# Patient Record
Sex: Male | Born: 1995 | Race: Black or African American | Hispanic: No | Marital: Single | Smoking: Current some day smoker
Health system: Southern US, Community
[De-identification: ages and names within clinical notes are randomized; demographics above are authoritative.]

---
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
## 2019-11-13 ENCOUNTER — Emergency Department (HOSPITAL_COMMUNITY): Payer: Federal, State, Local not specified - PPO

## 2019-11-13 ENCOUNTER — Encounter (HOSPITAL_COMMUNITY): Payer: Self-pay | Admitting: Emergency Medicine

## 2019-11-13 ENCOUNTER — Emergency Department (HOSPITAL_COMMUNITY)
Admission: EM | Admit: 2019-11-13 | Discharge: 2019-11-13 | Disposition: A | Discharge status: Home / Self Care | Payer: Federal, State, Local not specified - PPO | Attending: Emergency Medicine | Admitting: Emergency Medicine

## 2019-11-13 ENCOUNTER — Other Ambulatory Visit: Payer: Self-pay

## 2019-11-13 DIAGNOSIS — Z79899 Other long term (current) drug therapy: Secondary | ICD-10-CM | POA: Insufficient documentation

## 2019-11-13 DIAGNOSIS — F17210 Nicotine dependence, cigarettes, uncomplicated: Secondary | ICD-10-CM | POA: Diagnosis not present

## 2019-11-13 DIAGNOSIS — N451 Epididymitis: Secondary | ICD-10-CM | POA: Diagnosis not present

## 2019-11-13 DIAGNOSIS — R109 Unspecified abdominal pain: Secondary | ICD-10-CM | POA: Diagnosis present

## 2019-11-13 LAB — URINALYSIS, ROUTINE W REFLEX MICROSCOPIC
Bilirubin Urine: NEGATIVE
Glucose, UA: NEGATIVE mg/dL
Hgb urine dipstick: NEGATIVE
Ketones, ur: NEGATIVE mg/dL
Leukocytes,Ua: NEGATIVE
Nitrite: NEGATIVE
Protein, ur: NEGATIVE mg/dL
Specific Gravity, Urine: 1.017 (ref 1.005–1.030)
pH: 6 (ref 5.0–8.0)

## 2019-11-13 LAB — RPR: RPR Ser Ql: NONREACTIVE

## 2019-11-13 LAB — HIV ANTIBODY (ROUTINE TESTING W REFLEX): HIV Screen 4th Generation wRfx: NONREACTIVE

## 2019-11-13 MED ORDER — STERILE WATER FOR INJECTION IJ SOLN
INTRAMUSCULAR | Status: AC
  Administered 2019-11-13: 09:00:00 10 mL
  Filled 2019-11-13: qty 10

## 2019-11-13 MED ORDER — CEFTRIAXONE SODIUM 500 MG IJ SOLR
500.0000 mg | Freq: Once | INTRAMUSCULAR | Status: AC
  Administered 2019-11-13: 09:00:00 500 mg via INTRAMUSCULAR
  Filled 2019-11-13: qty 500

## 2019-11-13 MED ORDER — IBUPROFEN 600 MG PO TABS: 600.0000 mg | ORAL_TABLET | Freq: Four times a day (QID) | ORAL | 0 refills | Status: AC | PRN

## 2019-11-13 MED ORDER — DOXYCYCLINE HYCLATE 100 MG PO CAPS: 100.0000 mg | ORAL_CAPSULE | Freq: Two times a day (BID) | ORAL | 0 refills | Status: AC

## 2019-11-13 NOTE — Discharge Instructions (Addendum)
You have been diagnosed with epididymitis.  Follow instruction below.  Wear scrotal supporting underwear for comfort.  Avoid any sexual activities until your symptom completely resolved.  Take antibiotic as prescribed.

## 2019-11-13 NOTE — ED Triage Notes (Signed)
Patient reports right groin pain onset 2 days ago , denies injury , no hematuria .

## 2019-11-13 NOTE — ED Provider Notes (Signed)
[facility] Provider Note   CSN: [id number] Arrival date & time: 11/13/19  0102     History Chief Complaint  Patient presents with  . Groin Pain    Andrew Floyd is a 24 y.o. male.  The history is provided by the patient. No language interpreter was used.  Groin Pain     24 year old male presenting for evaluation of right groin pain.  Patient report for the past 2 days he noticed gradual onset of pain and swelling to his right testicle.  Pain is described as an achy sensation, more of a discomfort, mild to moderate in severity without any penile discharge dysuria hematuria or rash.  No associate fever or chills.  No abdominal pain or back pain.  No new sexual partners.  Denies any prior history of STI.  He did try taking ibuprofen at home with minimal improvement.  No history of hernia in the past.  History reviewed. No pertinent past medical history.  There are no problems to display for this patient.   History reviewed. No pertinent surgical history.     No family history on file.  Social History   Tobacco Use  . Smoking status: Current Some Day Smoker  . Smokeless tobacco: Never Used  Substance Use Topics  . Alcohol use: No  . Drug use: Not on file    Home Medications Prior to Admission medications   Medication Sig Start Date End Date Taking? Authorizing Provider  acetaminophen (TYLENOL) 500 MG tablet Take 1 tablet (500 mg total) by mouth every 6 (six) hours as needed for pain. 09/04/12   Rosana Hoes, MD  cetirizine-pseudoephedrine (ZYRTEC-D) 5-120 MG per tablet Take 1 tablet by mouth daily. 09/04/12   Rosana Hoes, MD    Allergies    Patient has no known allergies.  Review of Systems   Review of Systems  Constitutional: Negative for fever.  Genitourinary: Positive for scrotal swelling and testicular pain. Negative for difficulty urinating, dysuria, flank pain, frequency, genital sores and penile pain.    Physical  Exam Updated Vital Signs BP (!) 131/92 (BP Location: Right Arm)   Pulse 73   Temp 98.3 F (36.8 C) (Oral)   Resp 18   Ht 6\' 1"  (1.854 m)   Wt 100 kg   SpO2 99%   BMI 29.09 kg/m   Physical Exam Vitals and nursing note reviewed.  Constitutional:      General: He is not in acute distress.    Appearance: He is well-developed.  HENT:     Head: Atraumatic.  Eyes:     Conjunctiva/sclera: Conjunctivae normal.  Abdominal:     General: Abdomen is flat.     Palpations: Abdomen is soft.     Tenderness: There is no abdominal tenderness.  Genitourinary:    Penis: Normal.      Comments: Chaperone present during exam.  No inguinal lymphadenopathy or inguinal hernia noted.  Normal circumcised penis without lesions or rash.  No penile discharge.  Exquisite tenderness to palpation of right testicle with mild edema noted.  Perineum is soft. Musculoskeletal:     Cervical back: Neck supple.  Skin:    Findings: No rash.  Neurological:     Mental Status: He is alert.     ED Results / Procedures / Treatments   Labs (all labs ordered are listed, but only abnormal results are displayed) Labs Reviewed  URINALYSIS, ROUTINE W REFLEX MICROSCOPIC  RPR  HIV ANTIBODY (ROUTINE TESTING W REFLEX)  GC/CHLAMYDIA PROBE AMP ([place]) NOT AT [facility]    EKG None  Radiology US SCROTUM W/DOPPLER  Result Date: 11/13/2019 CLINICAL DATA:  Right testicular tenderness EXAM: SCROTAL ULTRASOUND DOPPLER ULTRASOUND OF THE TESTICLES TECHNIQUE: Complete ultrasound examination of the testicles, epididymis, and other scrotal structures was performed. Color and spectral Doppler ultrasound were also utilized to evaluate blood flow to the testicles. COMPARISON:  None FINDINGS: Right testicle Measurements: 3.3 x 2.0 x 2.5 cm. No mass or microlithiasis visualized. Left testicle Measurements: 4.0 x 2.1 x 2.8 cm. No mass or microlithiasis visualized. Also with some right greater than left scrotal thickening. Right epididymis:   Enlarged and heterogeneous Left epididymis:  Normal in size and appearance. Hydrocele:  Small right hydrocele Varicocele:  None visualized. Pulsed Doppler interrogation of both testes demonstrates normal low resistance arterial and venous waveforms bilaterally. IMPRESSION: Right-sided epididymitis. Electronically Signed   By: Donzetta Kohut M.D.   On: 11/13/2019 08:29    Procedures Procedures (including critical care time)  Medications Ordered in ED Medications  cefTRIAXone (ROCEPHIN) injection 500 mg (has no administration in time range)    ED Course  I have reviewed the triage vital signs and the nursing notes.  Pertinent labs & imaging results that were available during my care of the patient were reviewed by me and considered in my medical decision making (see chart for details).    MDM Rules/Calculators/A&P                      BP (!) 158/97   Pulse 70   Temp 98.3 F (36.8 C) (Oral)   Resp 17   Ht 6\' 1"  (1.854 m)   Wt 100 kg   SpO2 97% Comment: Room air  BMI 29.09 kg/m   Final Clinical Impression(s) / ED Diagnoses Final diagnoses:  Right epididymitis    Rx / DC Orders ED Discharge Orders         Ordered    doxycycline (VIBRAMYCIN) 100 MG capsule  2 times daily     11/13/19 0902    ibuprofen (ADVIL) 600 MG tablet  Every 6 hours PRN     11/13/19 0902         6:54 AM Patient here with right testicular tenderness suspect of epididymitis.  No obvious hernia appreciated on exam.  Will obtain scrotal ultrasound to rule out testicular torsion.  Patient will also be screened for STI.  8:57 AM Scrotal ultrasound demonstrated right-sided epididymitis.  UA is normal.  Additional STI test is currently pending.  We will give patient Rocephin 500 mg, and patient will be discharged home with doxycycline for 10 days. 01/13/20, PA-C 11/13/19 01/13/20    [id number], MD 11/14/19 [phone]

## 2019-11-13 NOTE — ED Notes (Signed)
Pt returned from US

## 2019-11-13 NOTE — ED Notes (Signed)
Pt ambulated to bathroom with a steady gait

## 2019-11-14 LAB — GC/CHLAMYDIA PROBE AMP (~~LOC~~) NOT AT ARMC
Chlamydia: POSITIVE — AB
Neisseria Gonorrhea: NEGATIVE

## 2021-02-17 IMAGING — US US SCROTUM W/ DOPPLER COMPLETE
1 series · 14 of 25 positions shown · non-contrast
Comparison: None

CLINICAL DATA: Right testicular tenderness

EXAM:
SCROTAL ULTRASOUND
DOPPLER ULTRASOUND OF THE TESTICLES
TECHNIQUE: Complete ultrasound examination of the testicles, epididymis, and
other scrotal structures was performed. Color and spectral Doppler
ultrasound were also utilized to evaluate blood flow to the
testicles.

[Series 1: us scrotum w/ doppler complete · 14 of 52 slices shown]
[im 1/52]
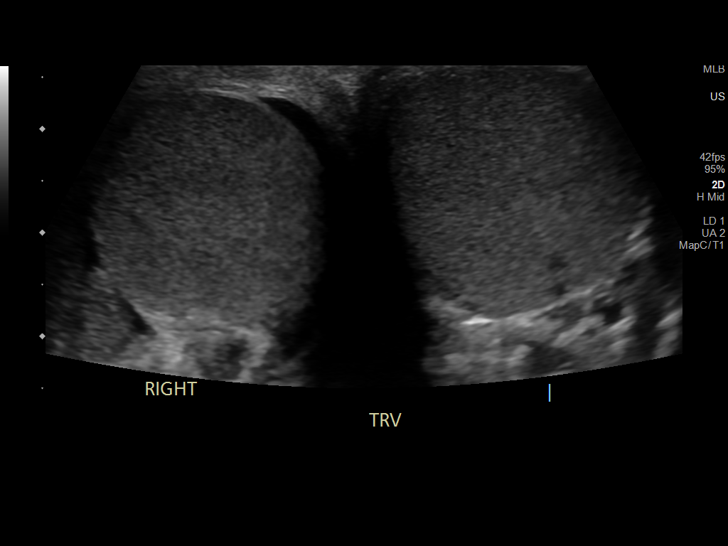
[im 5/52]
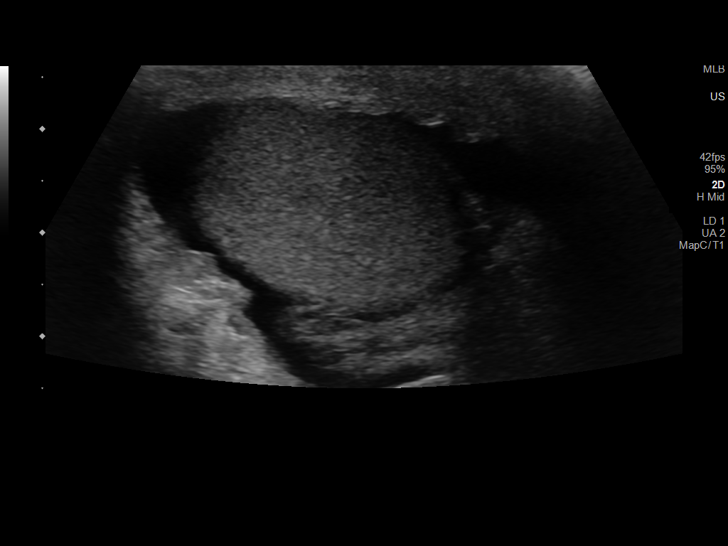
[im 9/52]
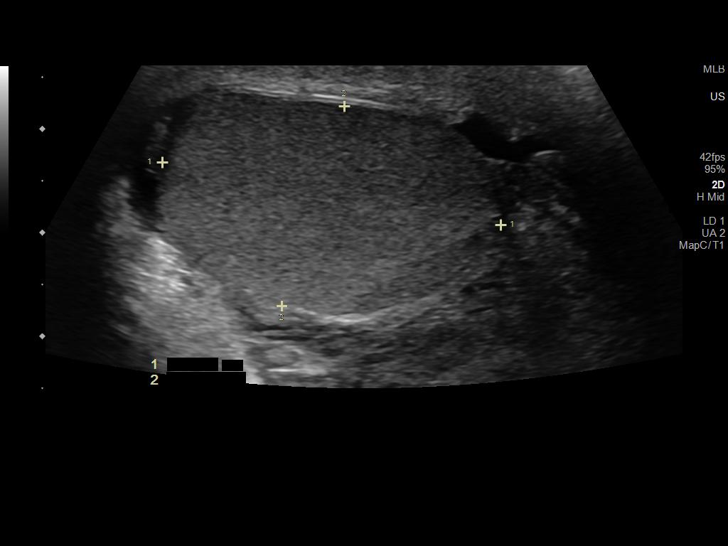
[im 13/52]
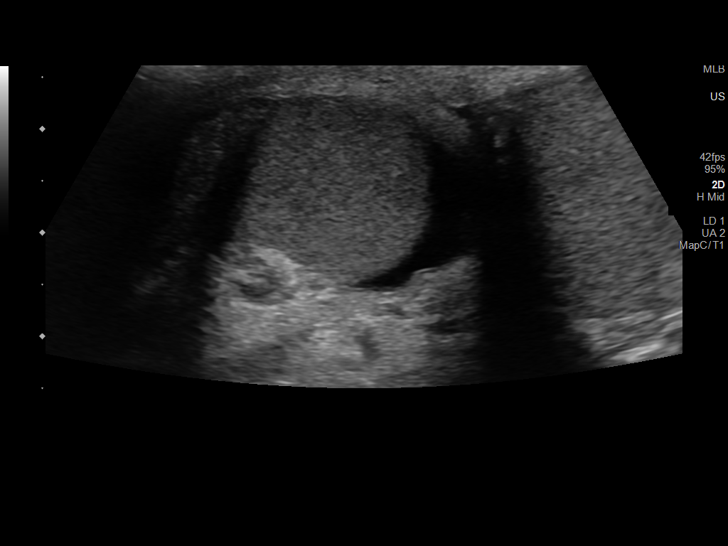
[im 18/52]
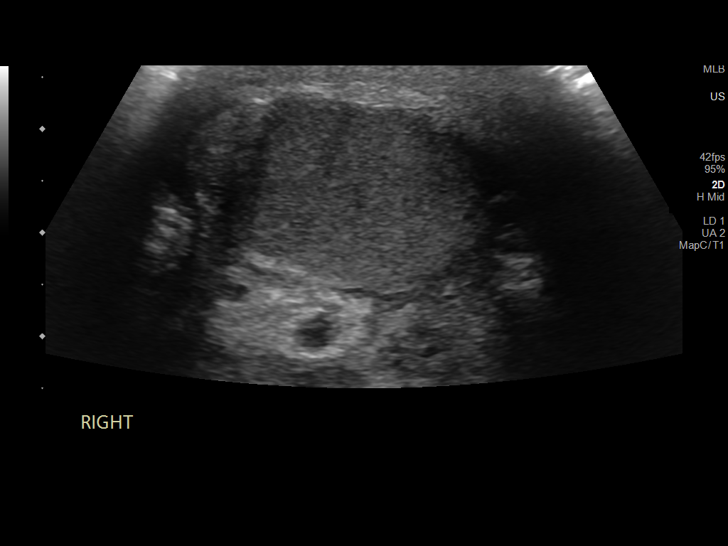
[im 20/52]
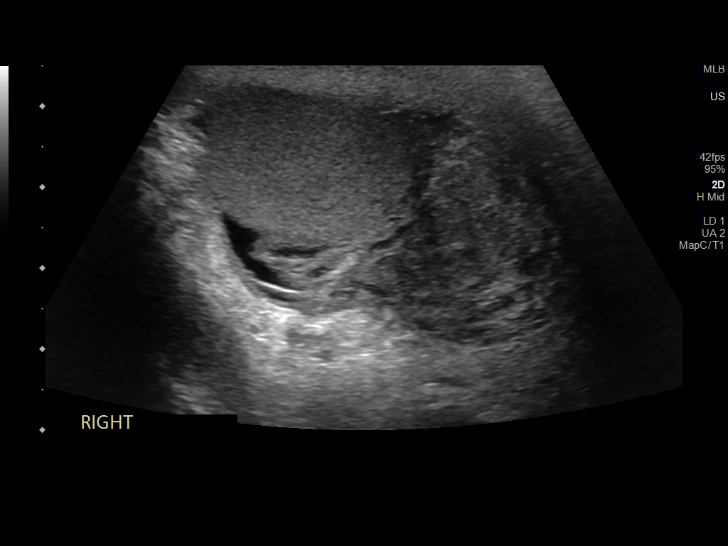
[im 24/52]
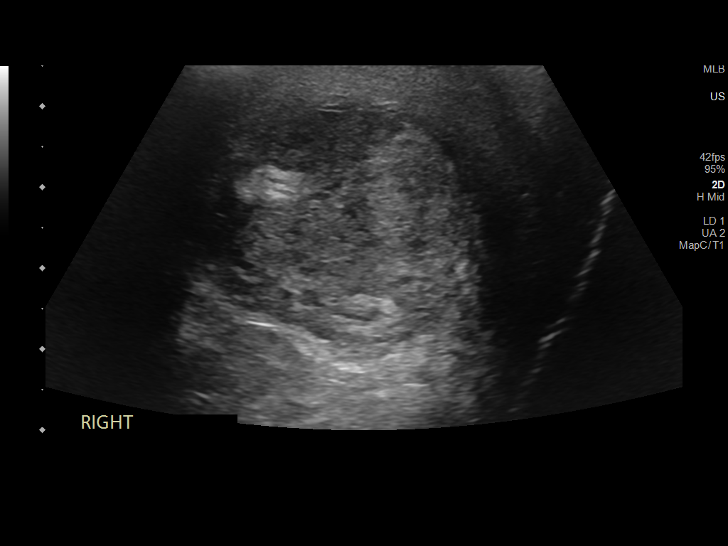
[im 28/52]
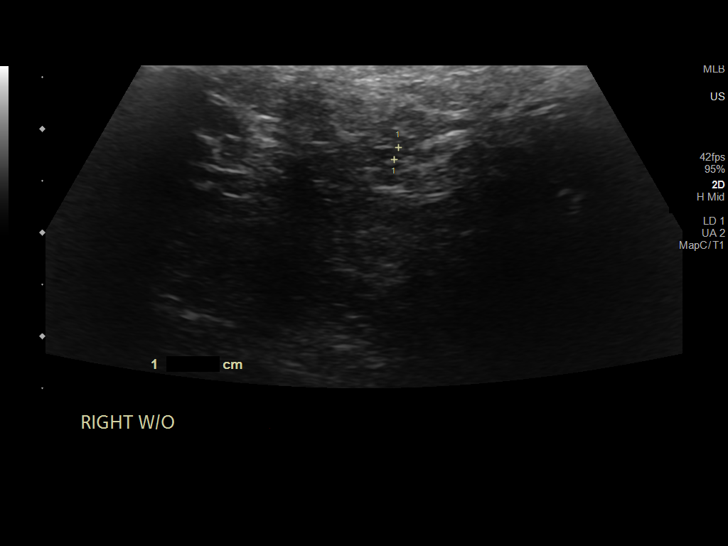
[im 32/52]
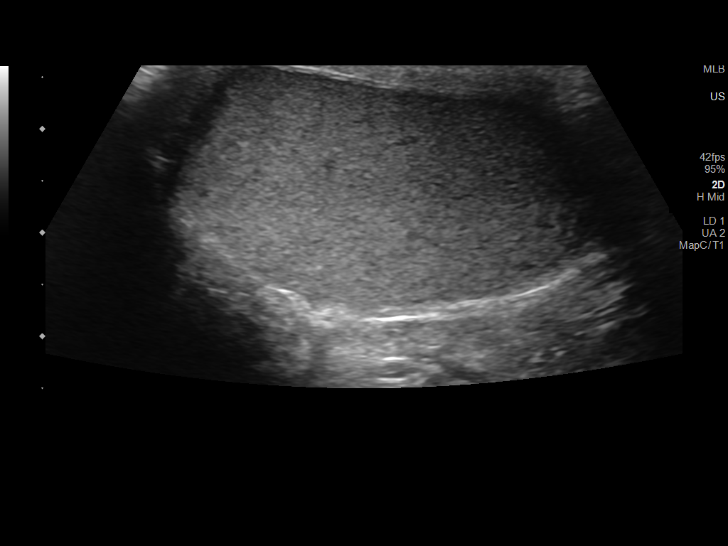
[im 35/52]
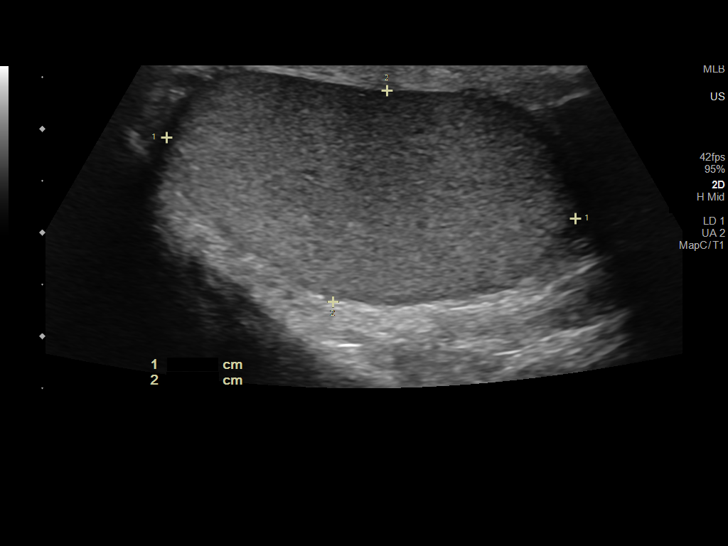
[im 39/52]
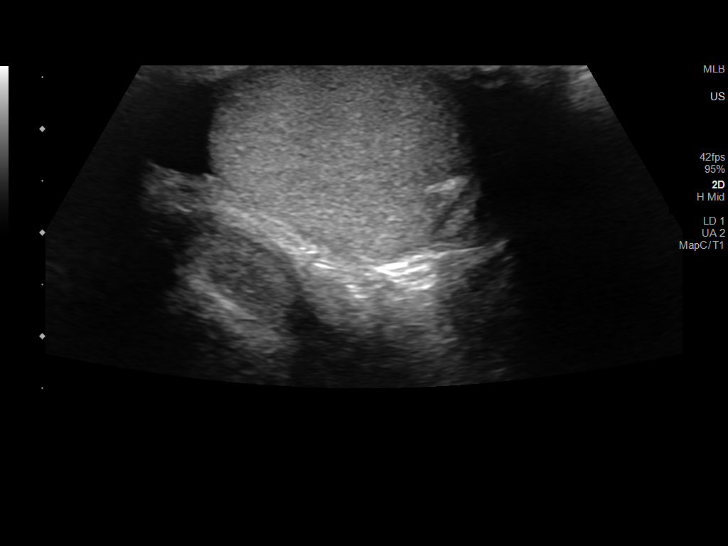
[im 43/52]
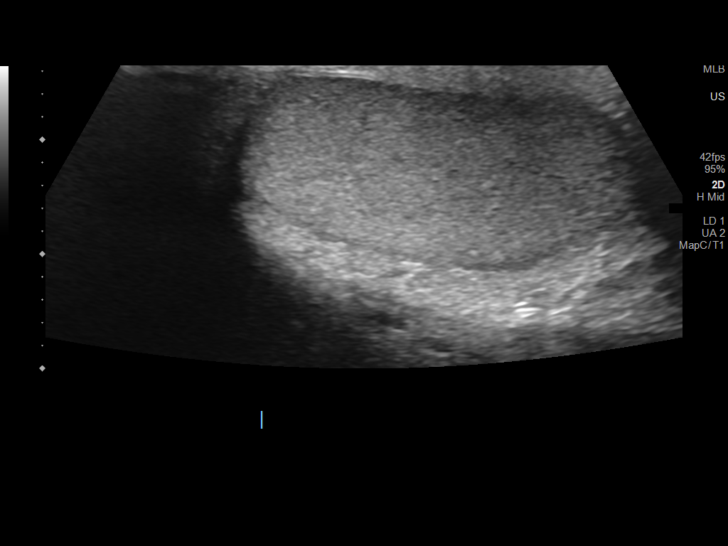
[im 47/52]
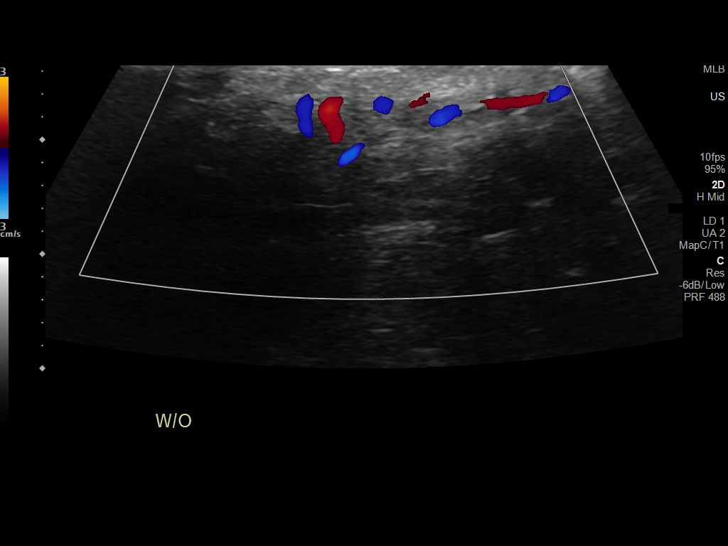
[im 52/52]
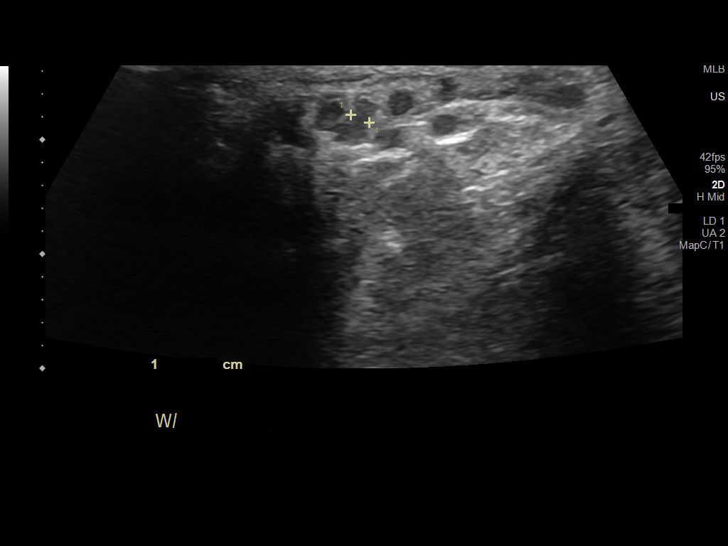

[14 of 25 positions shown; findings below may reference images not displayed]

FINDINGS: Right testicle

Measurements: 3.3 x 2.0 x 2.5 cm. No mass or microlithiasis
visualized.

Left testicle

Measurements: 4.0 x 2.1 x 2.8 cm. No mass or microlithiasis
visualized. Also with some right greater than left scrotal
thickening.

Right epididymis:  Enlarged and heterogeneous

Left epididymis:  Normal in size and appearance.

Hydrocele:  Small right hydrocele

Varicocele:  None visualized.

Pulsed Doppler interrogation of both testes demonstrates normal low
resistance arterial and venous waveforms bilaterally.
IMPRESSION: Right-sided epididymitis.
